# Patient Record
Sex: Male | Born: 1990 | Race: Black or African American | Hispanic: No | Marital: Single | Smoking: Never smoker
Health system: Southern US, Community
[De-identification: ages and names within clinical notes are randomized; demographics above are authoritative.]

## PROBLEM LIST (undated history)

## (undated) DIAGNOSIS — J45909 Unspecified asthma, uncomplicated: Secondary | ICD-10-CM

## (undated) HISTORY — PX: WRIST SURGERY: SHX841

---
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
## 2010-10-18 LAB — COMPREHENSIVE METABOLIC PANEL
AST: 50 — ABNORMAL HIGH
Albumin: 4.4
Alkaline Phosphatase: 64
BUN: 12
Chloride: 106
Potassium: 3.7
Sodium: 138
Total Bilirubin: 1.2
Total Protein: 7.7

## 2010-10-18 LAB — URINALYSIS, ROUTINE W REFLEX MICROSCOPIC
Protein, ur: NEGATIVE
Specific Gravity, Urine: 1.014
Urobilinogen, UA: 1

## 2010-10-18 LAB — URINE MICROSCOPIC-ADD ON

## 2010-10-18 LAB — RAPID URINE DRUG SCREEN, HOSP PERFORMED
Amphetamines: NOT DETECTED
Benzodiazepines: NOT DETECTED

## 2010-10-18 LAB — DIFFERENTIAL
Basophils Absolute: 0
Basophils Relative: 0
Eosinophils Relative: 2
Monocytes Absolute: 0.3
Monocytes Relative: 7
Neutro Abs: 3

## 2010-10-18 LAB — POCT I-STAT, CHEM 8
BUN: 14
Calcium, Ion: 1.14
Chloride: 105
Glucose, Bld: 94
HCT: 53 — ABNORMAL HIGH
TCO2: 24

## 2010-10-18 LAB — ETHANOL: Alcohol, Ethyl (B): 40 — ABNORMAL HIGH

## 2010-10-18 LAB — CBC
HCT: 50.1 — ABNORMAL HIGH
Platelets: 162
RDW: 12.8
WBC: 4.3 — ABNORMAL LOW

## 2016-01-08 ENCOUNTER — Emergency Department (HOSPITAL_COMMUNITY)
Admission: EM | Admit: 2016-01-08 | Discharge: 2016-01-08 | Disposition: A | Discharge status: Home / Self Care | Payer: Self-pay | Attending: Emergency Medicine | Admitting: Emergency Medicine

## 2016-01-08 ENCOUNTER — Encounter (HOSPITAL_COMMUNITY): Payer: Self-pay | Admitting: *Deleted

## 2016-01-08 DIAGNOSIS — R369 Urethral discharge, unspecified: Secondary | ICD-10-CM | POA: Insufficient documentation

## 2016-01-08 LAB — URINALYSIS, ROUTINE W REFLEX MICROSCOPIC
BILIRUBIN URINE: NEGATIVE
Bacteria, UA: NONE SEEN
GLUCOSE, UA: NEGATIVE mg/dL
Hgb urine dipstick: NEGATIVE
KETONES UR: NEGATIVE mg/dL
NITRITE: NEGATIVE
PH: 6 (ref 5.0–8.0)
Protein, ur: NEGATIVE mg/dL
Specific Gravity, Urine: 1.003 — ABNORMAL LOW (ref 1.005–1.030)
Squamous Epithelial / LPF: NONE SEEN

## 2016-01-08 MED ORDER — CEFTRIAXONE SODIUM 250 MG IJ SOLR
250.0000 mg | Freq: Once | INTRAMUSCULAR | Status: AC
  Administered 2016-01-08: 250 mg via INTRAMUSCULAR
  Filled 2016-01-08: qty 250

## 2016-01-08 MED ORDER — AZITHROMYCIN 250 MG PO TABS
1000.0000 mg | ORAL_TABLET | Freq: Once | ORAL | Status: AC
  Administered 2016-01-08: 1000 mg via ORAL
  Filled 2016-01-08: qty 4

## 2016-01-08 MED ORDER — STERILE WATER FOR INJECTION IJ SOLN
INTRAMUSCULAR | Status: AC
  Administered 2016-01-08: 10 mL
  Filled 2016-01-08: qty 10
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
## 2016-01-08 NOTE — ED Notes (Signed)
Pt is in stable condition upon d/c and ambulates from ED. 

## 2016-01-08 NOTE — ED Triage Notes (Signed)
Pt is requesting STD screening rt penile d/c and itching for over a week.

## 2016-01-08 NOTE — Discharge Instructions (Signed)
You were treated for Gonorrhea and Chlamydia Results will be available in 2 days. You can access them on MyChart Please have all partners tested Wear condoms to prevent infection or unwanted pregnancy

## 2016-01-09 LAB — HIV ANTIBODY (ROUTINE TESTING W REFLEX): HIV Screen 4th Generation wRfx: NONREACTIVE

## 2016-01-09 LAB — GC/CHLAMYDIA PROBE AMP (~~LOC~~) NOT AT ARMC
Chlamydia: POSITIVE — AB
Neisseria Gonorrhea: NEGATIVE

## 2016-01-09 LAB — RPR: RPR Ser Ql: NONREACTIVE

## 2016-01-11 ENCOUNTER — Encounter (HOSPITAL_COMMUNITY): Payer: Self-pay | Admitting: Medical

## 2016-06-26 ENCOUNTER — Emergency Department (HOSPITAL_COMMUNITY)
Admission: EM | Admit: 2016-06-26 | Discharge: 2016-06-26 | Disposition: A | Discharge status: Home / Self Care | Payer: Self-pay | Attending: Emergency Medicine | Admitting: Emergency Medicine

## 2016-06-26 ENCOUNTER — Encounter (HOSPITAL_COMMUNITY): Payer: Self-pay | Admitting: *Deleted

## 2016-06-26 DIAGNOSIS — R51 Headache: Secondary | ICD-10-CM | POA: Insufficient documentation

## 2016-06-26 DIAGNOSIS — I10 Essential (primary) hypertension: Secondary | ICD-10-CM | POA: Insufficient documentation

## 2016-06-26 HISTORY — DX: Unspecified asthma, uncomplicated: J45.909

## 2016-06-26 NOTE — ED Provider Notes (Signed)
[facility] Provider Note   CSN: [id number] Arrival date & time: 06/26/16  1217  By signing my name below, I, Chris Paul, attest that this documentation has been prepared under the direction and in the presence of Chris IndustriesJeff Kirah Stice, PA-C. Electronically Signed: Linna Darnerussell Paul, Scribe. 06/26/2016. 1:12 PM.  History   Chief Complaint Chief Complaint  Patient presents with  . Palpitations  . Headache   The history is provided by the patient. No language interpreter was used.    HPI Comments: Chris Paul is a 26 y.o. male who presents to the Emergency Department complaining of persistent hypertension and hypotension for four weeks. Patient donates plasma twice weekly and notes he has been hypertensive or hypotensive and intermittently tachycardic during each visit over the last four weeks. He states he stands in line anywhere from 30 seconds to 30 minutes prior to donating and sits for 2-3 minutes prior to having his blood pressure and pulse measured. He is reporting some intermittent mild headaches for the last two weeks but is otherwise asymptomatic. Patient denies regular caffeine intake but does note that he eats Ramen Noodles on a regular basis. He smokes marijuana but does not smoke cigarettes and denies any illicit drug use. Patient denies numbness/tingling, focal weakness, chest pain, leg swelling, or any other associated symptoms. No PCP or health insurance. He is currently employed.  Past Medical History:  Diagnosis Date  . Asthma     There are no active problems to display for this patient.   Past Surgical History:  Procedure Laterality Date  . WRIST SURGERY         Home Medications    Prior to Admission medications   Not on File    Family History No family history on file.  Social History Social History  Substance Use Topics  . Smoking status: Never Smoker  . Smokeless tobacco: Never Used  . Alcohol use Yes     Comment: 24 oz beer per day      Allergies   Patient has no known allergies.   Review of Systems Review of Systems  Cardiovascular: Negative for chest pain and leg swelling.  Neurological: Positive for headaches. Negative for weakness and numbness.  All other systems reviewed and are negative.  Physical Exam Updated Vital Signs BP (!) 141/84 (BP Location: Left Arm)   Pulse 62   Temp 97.4 F (36.3 C) (Oral)   Resp 16   Ht 5\' 9"  (1.753 m)   Wt 105.7 kg (233 lb)   SpO2 97%   BMI 34.41 kg/m   Physical Exam  Constitutional: He is oriented to person, place, and time. He appears well-developed and well-nourished. No distress.  HENT:  Head: Normocephalic and atraumatic.  Eyes: Conjunctivae and EOM are normal.  Neck: Neck supple. No tracheal deviation present.  Cardiovascular: Normal rate, regular rhythm and normal heart sounds.   Pulmonary/Chest: Effort normal and breath sounds normal. No respiratory distress. He has no wheezes. He has no rales.  Musculoskeletal: Normal range of motion.  Neurological: He is alert and oriented to person, place, and time.  Skin: Skin is warm and dry.  Psychiatric: He has a normal mood and affect. His behavior is normal.  Nursing note and vitals reviewed.  ED Treatments / Results  Labs (all labs ordered are listed, but only abnormal results are displayed) Labs Reviewed - No data to display  EKG  EKG Interpretation None       Radiology No results found.  Procedures Procedures (  including critical care time)  DIAGNOSTIC STUDIES: Oxygen Saturation is 97% on RA, normal by my interpretation.    COORDINATION OF CARE: 1:11 PM Discussed treatment plan with pt at bedside and pt agreed to plan.  Medications Ordered in ED Medications - No data to display   Initial Impression / Assessment and Plan / ED Course  I have reviewed the triage vital signs and the nursing notes.  Pertinent labs & imaging results that were available during my care of the patient were  reviewed by me and considered in my medical decision making (see chart for details).      Final Clinical Impressions(s) / ED Diagnoses   Final diagnoses:  Hypertension, unspecified type    Patient presents for evaluation of his blood pressure readings.  Patient has had both hypertension low blood pressure readings while at the plasma donation clinic.  He notes some tachycardia.  Very low suspicion for any cardiac abnormalities as patient is asymptomatic.  Patient has very reassuring vital signs here with only slight elevation in his blood pressure.  Patient denies any significant complaints including chest pain shortness of breath, palpitations dizziness or any other concerning signs or symptoms now or during these episodes.  Patient will be referred to primary care, given strict return precautions.  He verbalized understanding and agreement to today's plan had no further questions or concerns.  New Prescriptions There are no discharge medications for this patient.  I personally performed the services described in this documentation, which was scribed in my presence. The recorded information has been reviewed and is accurate.   Eyvonne Mechanic, PA-C 06/26/16 2006    Nira Conn, MD 06/29/16 0100

## 2016-06-26 NOTE — Discharge Instructions (Signed)
Please read attached information. If you experience any new or worsening signs or symptoms please return to the emergency room for evaluation. Please follow-up with your primary care provider or specialist as discussed.  °

## 2016-06-26 NOTE — ED Triage Notes (Signed)
Pt states "tachycardia" for several weeks.  Felt his heart racing over that past week and when he took his pulse it was 100.  Also states headache x 2 weeks, though denies nausea, blurred vision, etc.

## 2019-11-11 ENCOUNTER — Emergency Department (HOSPITAL_COMMUNITY)
Admission: EM | Admit: 2019-11-11 | Discharge: 2019-11-11 | Disposition: A | Discharge status: Home / Self Care | Payer: Self-pay | Attending: Emergency Medicine | Admitting: Emergency Medicine

## 2019-11-11 ENCOUNTER — Other Ambulatory Visit: Payer: Self-pay

## 2019-11-11 ENCOUNTER — Encounter (HOSPITAL_COMMUNITY): Payer: Self-pay | Admitting: *Deleted

## 2019-11-11 DIAGNOSIS — Z5321 Procedure and treatment not carried out due to patient leaving prior to being seen by health care provider: Secondary | ICD-10-CM | POA: Insufficient documentation

## 2019-11-11 DIAGNOSIS — N4889 Other specified disorders of penis: Secondary | ICD-10-CM | POA: Insufficient documentation
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
## 2019-11-11 NOTE — ED Notes (Signed)
Pt did not answer x 3 

## 2019-11-18 ENCOUNTER — Encounter (HOSPITAL_COMMUNITY): Payer: Self-pay

## 2019-11-18 ENCOUNTER — Other Ambulatory Visit: Payer: Self-pay

## 2019-11-18 ENCOUNTER — Emergency Department (HOSPITAL_COMMUNITY)
Admission: EM | Admit: 2019-11-18 | Discharge: 2019-11-18 | Disposition: A | Discharge status: Home / Self Care | Payer: Self-pay | Attending: Emergency Medicine | Admitting: Emergency Medicine

## 2019-11-18 DIAGNOSIS — J45909 Unspecified asthma, uncomplicated: Secondary | ICD-10-CM | POA: Insufficient documentation

## 2019-11-18 DIAGNOSIS — S3121XA Laceration without foreign body of penis, initial encounter: Secondary | ICD-10-CM | POA: Insufficient documentation

## 2019-11-18 DIAGNOSIS — X58XXXA Exposure to other specified factors, initial encounter: Secondary | ICD-10-CM | POA: Insufficient documentation
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
## 2019-11-18 NOTE — ED Provider Notes (Signed)
[facility] EMERGENCY DEPARTMENT Provider Note   CSN: [id number] Arrival date & time: 11/18/19  1647     History Chief Complaint  Patient presents with  . Laceration    Chris Paul is a 29 y.o. male.  Chris Paul is a 29 year old male who presents to the ED with 2 weeks of a poorly healing penile laceration.  His previous medical history is significant for being uncircumcised.  He reports that he was having intercourse 2 weeks ago when he felt the skin at the front of his penis tear during penetration.  At that time, he stopped intercourse and abstain from intercourse for several days until felt like the injury had improved.  Several days later, he attempted intercourse again when he again experienced a tearing of the skin at the front of his penis.  It has now been about 1.5 weeks since his last attempted intercourse and his penis does appear to be healing well.  He has not noted any swelling, redness or drainage from the small cut in the skin at the front of his penis.  He specifically denies penile discharge.  He continues to have morning erections but generally has been avoiding any sexual behavior in order to prevent as much time as possible for healing.  At this point, he is come to the ED because he feels that the small injury is not totally recovered wants to be sure that he has a full recovery.        Past Medical History:  Diagnosis Date  . Asthma     There are no problems to display for this patient.   Past Surgical History:  Procedure Laterality Date  . WRIST SURGERY         No family history on file.  Social History   Tobacco Use  . Smoking status: Never Smoker  . Smokeless tobacco: Never Used  Substance Use Topics  . Alcohol use: Yes    Comment: 24 oz beer per day  . Drug use: Yes    Types: Marijuana    Home Medications Prior to Admission medications   Not on File    Allergies    Patient has no known  allergies.  Review of Systems   Review of Systems  Constitutional: Negative for chills, fatigue and fever.  HENT: Negative for congestion and sore throat.   Respiratory: Negative for chest tightness and shortness of breath.   Gastrointestinal: Negative for nausea and vomiting.  Genitourinary: Positive for penile pain. Negative for discharge, dysuria, penile swelling, scrotal swelling, testicular pain and urgency.    Physical Exam Updated Vital Signs BP (!) 148/92   Pulse (!) 114   Temp 98.5 F (36.9 C) (Oral)   Resp 16   Ht 5\' 8"  (1.727 m)   Wt 113.4 kg   SpO2 95%   BMI 38.01 kg/m   Physical Exam Constitutional:      Appearance: Normal appearance.  HENT:     Head: Normocephalic and atraumatic.     Right Ear: External ear normal.     Left Ear: External ear normal.     Nose: Nose normal.     Mouth/Throat:     Mouth: Mucous membranes are dry.  Eyes:     Extraocular Movements: Extraocular movements intact.     Pupils: Pupils are equal, round, and reactive to light.  Pulmonary:     Effort: Pulmonary effort is normal. No respiratory distress.  Genitourinary:    Comments: Physical exam  was notable for a roughly 3 mm laceration noted on the frenulum of the penis.  There is no evidence of erythema, swelling, drainage.  The injury appears to be well-healing. Musculoskeletal:     Cervical back: Normal range of motion.  Skin:    Capillary Refill: Capillary refill takes less than 2 seconds.  Neurological:     General: No focal deficit present.     Mental Status: He is alert.     Cranial Nerves: No cranial nerve deficit.  Psychiatric:        Mood and Affect: Mood normal.        Behavior: Behavior normal.     ED Results / Procedures / Treatments   Labs (all labs ordered are listed, but only abnormal results are displayed) Labs Reviewed - No data to display  EKG None  Radiology No results found.  Procedures Procedures (including critical care time)  Medications  Ordered in ED Medications - No data to display  ED Course  I have reviewed the triage vital signs and the nursing notes.  Pertinent labs & imaging results that were available during my care of the patient were reviewed by me and considered in my medical decision making (see chart for details).    MDM Rules/Calculators/A&P                          Chris Paul is a 29 year old male who presents to the ED with 2 weeks of a poorly healing penile laceration.  His previous medical history is significant for being uncircumcised.  On physical exam, there was no evidence of infection in this small injury to the frenulum of his penis appeared to be well-healing.  He was encouraged to continue to abstain from intercourse until he is fully healed and to follow-up with urology in the next week or so for additional information and advice.   Final Clinical Impression(s) / ED Diagnoses Final diagnoses:  Laceration of penis, initial encounter    Rx / DC Orders ED Discharge Orders    None       Mirian Mo, MD 11/18/19 Ovidio Kin    Lorre Nick, MD 11/22/19 2313

## 2019-11-18 NOTE — Discharge Instructions (Addendum)
Overall, I think this small injury is going to heal well without complication.  I do think you should follow-up with a urologist in the next week or so.  Please call the number that we have provided to schedule an appointment.  Until you are seen by urology, I recommend you abstain from any vaginal intercourse or other sexual activity.

## 2020-08-29 ENCOUNTER — Emergency Department (HOSPITAL_COMMUNITY)
Admission: EM | Admit: 2020-08-29 | Discharge: 2020-08-30 | Disposition: A | Discharge status: Home / Self Care | Payer: Self-pay | Attending: Emergency Medicine | Admitting: Emergency Medicine

## 2020-08-29 DIAGNOSIS — Z5321 Procedure and treatment not carried out due to patient leaving prior to being seen by health care provider: Secondary | ICD-10-CM | POA: Insufficient documentation

## 2020-08-29 DIAGNOSIS — Z041 Encounter for examination and observation following transport accident: Secondary | ICD-10-CM | POA: Insufficient documentation
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
## 2020-08-29 NOTE — ED Notes (Signed)
Pt called to triage room x2, no answer

## 2020-08-29 NOTE — ED Notes (Signed)
Pt called to triage room x2, no answer 

## 2020-08-30 NOTE — ED Notes (Signed)
Significant other called regarding pt elopement & belongings left in triage. States she will come get bookbag when able.

## 2020-11-24 ENCOUNTER — Encounter (HOSPITAL_COMMUNITY): Payer: Self-pay | Admitting: Emergency Medicine

## 2020-11-24 ENCOUNTER — Emergency Department (HOSPITAL_COMMUNITY)
Admission: EM | Admit: 2020-11-24 | Discharge: 2020-11-24 | Disposition: A | Discharge status: Home / Self Care | Payer: Self-pay | Attending: Emergency Medicine | Admitting: Emergency Medicine

## 2020-11-24 ENCOUNTER — Other Ambulatory Visit: Payer: Self-pay

## 2020-11-24 DIAGNOSIS — R202 Paresthesia of skin: Secondary | ICD-10-CM | POA: Insufficient documentation

## 2020-11-24 DIAGNOSIS — J45909 Unspecified asthma, uncomplicated: Secondary | ICD-10-CM | POA: Insufficient documentation

## 2020-11-24 DIAGNOSIS — Z711 Person with feared health complaint in whom no diagnosis is made: Secondary | ICD-10-CM

## 2020-11-24 DIAGNOSIS — R369 Urethral discharge, unspecified: Secondary | ICD-10-CM | POA: Insufficient documentation

## 2020-11-24 LAB — URINALYSIS, ROUTINE W REFLEX MICROSCOPIC
Bacteria, UA: NONE SEEN
Bilirubin Urine: NEGATIVE
Glucose, UA: NEGATIVE mg/dL
Hgb urine dipstick: NEGATIVE
Ketones, ur: 5 mg/dL — AB
Nitrite: NEGATIVE
Protein, ur: NEGATIVE mg/dL
Specific Gravity, Urine: 1.014 (ref 1.005–1.030)
pH: 5 (ref 5.0–8.0)

## 2020-11-24 MED ORDER — CEFTRIAXONE SODIUM 500 MG IJ SOLR
500.0000 mg | Freq: Once | INTRAMUSCULAR | Status: AC
  Administered 2020-11-24: 500 mg via INTRAMUSCULAR
  Filled 2020-11-24: qty 500

## 2020-11-24 MED ORDER — LIDOCAINE HCL (PF) 1 % IJ SOLN
INTRAMUSCULAR | Status: AC
  Filled 2020-11-24: qty 5

## 2020-11-24 MED ORDER — DOXYCYCLINE HYCLATE 100 MG PO CAPS: 100.0000 mg | ORAL_CAPSULE | Freq: Two times a day (BID) | ORAL | 0 refills | Status: DC

## 2020-11-24 NOTE — ED Triage Notes (Signed)
Pt c/o 2 days of penile d/c and dysuria. Denies n/v/d, denies fever chills, abd pain

## 2020-11-24 NOTE — ED Provider Notes (Signed)
Emergency Medicine Provider Triage Evaluation Note  Chris Paul , a 29 y.o. male  was evaluated in triage.  Pt complains of 2 days of dysuria and penile discharge.  No fevers, chills, nausea, vomiting, Donnell pain.  He sexually active with 1 male partner, does not use condoms.  History of gonorrhea previously, has been treated appropriately.  This feels different than when he had gonorrhea before.  He is concerned for UTI. No medical problems.   Review of Systems  Positive: Dysuria, while penile discharge Negative: fever  Physical Exam  BP (!) 151/107 (BP Location: Left Arm)   Pulse 87   Temp 98.9 F (37.2 C) (Oral)   Resp 16   SpO2 100%  Gen:   Awake, no distress   Resp:  Normal effort  MSK:   Moves extremities without difficulty  Other:  No ttp of abd  Medical Decision Making  Medically screening exam initiated at 4:17 PM.  Appropriate orders placed.  JOSPEH MANGEL was informed that the remainder of the evaluation will be completed by another provider, this initial triage assessment does not replace that evaluation, and the importance of remaining in the ED until their evaluation is complete.  Ua, gc/cl   Alveria Apley, PA-C 11/24/20 1617    Sloan Leiter, DO 11/24/20 1955

## 2020-11-24 NOTE — Discharge Instructions (Addendum)
I am prescribing you an antibiotic called doxycycline.  Please take this twice a day for the next 7 days.  Do not stop taking this antibiotic early.  Please refrain from sexual intercourse until you complete this antibiotic.  Please check the results of your gonorrhea/chlamydia test on MyChart.  This should be available in the next 2 to 3 days.  If you develop any new or worsening symptoms please come back to the emergency department.  It was a pleasure to meet you.

## 2020-11-24 NOTE — ED Notes (Signed)
Patient verbalizes understanding of discharge instructions. Prescriptions and follow-up care reviewed. Opportunity for questioning and answers were provided. Armband removed by staff, pt discharged from ED ambulatory.  

## 2020-11-24 NOTE — ED Provider Notes (Signed)
[facility] EMERGENCY DEPARTMENT Provider Note   CSN: [id number] Arrival date & time: 11/24/20  1406     History Chief Complaint  Patient presents with   Penile Discharge    Chris Paul is a 30 y.o. male.  HPI  Patient is a 30 year old male who presents to the emergency department due to penile discharge.  Patient states about a week ago he began experiencing "tingling" with urination.  Denies any dysuria or hematuria.  Yesterday after urinating he noticed a small amount of white discharge.  States this has not recurred since yesterday.  Denies any fevers, chills, nausea, vomiting, diarrhea, abdominal pain.  States he has been in a monogamous relationship with the same male partner for the last 3 years.  Denies using protection.     Past Medical History:  Diagnosis Date   Asthma     There are no problems to display for this patient.   Past Surgical History:  Procedure Laterality Date   WRIST SURGERY         No family history on file.  Social History   Tobacco Use   Smoking status: Never   Smokeless tobacco: Never  Substance Use Topics   Alcohol use: Yes    Comment: 24 oz beer per day   Drug use: Yes    Types: Marijuana    Home Medications Prior to Admission medications   Medication Sig Start Date End Date Taking? Authorizing Provider  doxycycline (VIBRAMYCIN) 100 MG capsule Take 1 capsule (100 mg total) by mouth 2 (two) times daily. 11/24/20  Yes Placido Sou, PA-C    Allergies    Patient has no known allergies.  Review of Systems   Review of Systems  Constitutional:  Negative for chills and fever.  Gastrointestinal:  Negative for abdominal pain, diarrhea, nausea and vomiting.  Genitourinary:  Positive for penile discharge. Negative for dysuria, hematuria, penile pain, penile swelling, scrotal swelling, testicular pain and urgency.   Physical Exam Updated Vital Signs BP (!) 151/107 (BP Location: Left Arm)   Pulse 87    Temp 98.9 F (37.2 C) (Oral)   Resp 16   SpO2 100%   Physical Exam Vitals and nursing note reviewed.  Constitutional:      General: He is not in acute distress.    Appearance: He is well-developed.  HENT:     Head: Normocephalic and atraumatic.     Right Ear: External ear normal.     Left Ear: External ear normal.  Eyes:     General: No scleral icterus.       Right eye: No discharge.        Left eye: No discharge.     Conjunctiva/sclera: Conjunctivae normal.  Neck:     Trachea: No tracheal deviation.  Cardiovascular:     Rate and Rhythm: Normal rate.  Pulmonary:     Effort: Pulmonary effort is normal. No respiratory distress.     Breath sounds: No stridor.  Abdominal:     General: Abdomen is flat. There is no distension.     Palpations: Abdomen is soft.  Genitourinary:    Comments: Patient deferred GU exam.  Musculoskeletal:        General: No swelling or deformity.     Cervical back: Neck supple.  Skin:    General: Skin is warm and dry.     Findings: No rash.  Neurological:     Mental Status: He is alert.     Cranial Nerves:  Cranial nerve deficit: no gross deficits.   ED Results / Procedures / Treatments   Labs (all labs ordered are listed, but only abnormal results are displayed) Labs Reviewed  URINALYSIS, ROUTINE W REFLEX MICROSCOPIC - Abnormal; Notable for the following components:      Result Value   Ketones, ur 5 (*)    Leukocytes,Ua SMALL (*)    All other components within normal limits  GC/CHLAMYDIA PROBE AMP ([place]) NOT AT [facility]    EKG None  Radiology No results found.  Procedures Procedures   Medications Ordered in ED Medications  cefTRIAXone (ROCEPHIN) injection 500 mg (has no administration in time range)    ED Course  I have reviewed the triage vital signs and the nursing notes.  Pertinent labs & imaging results that were available during my care of the patient were reviewed by me and considered in my medical decision making (see  chart for details).    MDM Rules/Calculators/A&P                          Patient is a 30 year old male who presents to the emergency department due to tingling sensation with urination as well as an episode of penile discharge yesterday.  States that he is sexually active with one male and has been in a long-term monogamous relationship.  Denies that he uses protection.  UA shows 5 ketones, small leukocytes, 11-20 white blood cells.  GC/chlamydia is pending.  Patient deferred GU exam.  Denies any other complaints at this time.  States that he would prefer to be treated prophylactically for gonorrhea/chlamydia.  Will treat with IM Rocephin as well as p.o. doxycycline.  Urged patient to refrain from sexual intercourse until he completes his antibiotics.  Patient is going to check the results of his test on MyChart.  We discussed return precautions.  His questions were answered and he was amicable at the time of discharge.  Final Clinical Impression(s) / ED Diagnoses Final diagnoses:  Concern about STD in male without diagnosis   Rx / DC Orders ED Discharge Orders          Ordered    doxycycline (VIBRAMYCIN) 100 MG capsule  2 times daily        11/24/20 1901             Rayna Sexton, PA-C 11/24/20 1912    Horton, Alvin Critchley, DO 11/25/20 1854

## 2022-01-29 ENCOUNTER — Emergency Department (HOSPITAL_COMMUNITY)
Admission: EM | Admit: 2022-01-29 | Discharge: 2022-01-29 | Disposition: A | Discharge status: Home / Self Care | Payer: Self-pay | Attending: Emergency Medicine | Admitting: Emergency Medicine

## 2022-01-29 ENCOUNTER — Other Ambulatory Visit: Payer: Self-pay

## 2022-01-29 ENCOUNTER — Encounter (HOSPITAL_COMMUNITY): Payer: Self-pay | Admitting: Emergency Medicine

## 2022-01-29 DIAGNOSIS — Z202 Contact with and (suspected) exposure to infections with a predominantly sexual mode of transmission: Secondary | ICD-10-CM | POA: Insufficient documentation

## 2022-01-29 DIAGNOSIS — Z711 Person with feared health complaint in whom no diagnosis is made: Secondary | ICD-10-CM

## 2022-01-29 DIAGNOSIS — R369 Urethral discharge, unspecified: Secondary | ICD-10-CM | POA: Insufficient documentation

## 2022-01-29 MED ORDER — DOXYCYCLINE HYCLATE 100 MG PO TABS
100.0000 mg | ORAL_TABLET | Freq: Once | ORAL | Status: AC
  Administered 2022-01-29: 100 mg via ORAL
  Filled 2022-01-29: qty 1

## 2022-01-29 MED ORDER — STERILE WATER FOR INJECTION IJ SOLN
INTRAMUSCULAR | Status: AC
  Filled 2022-01-29: qty 10

## 2022-01-29 MED ORDER — DOXYCYCLINE HYCLATE 100 MG PO CAPS: 100.0000 mg | ORAL_CAPSULE | Freq: Two times a day (BID) | ORAL | 0 refills | Status: AC

## 2022-01-29 MED ORDER — CEFTRIAXONE SODIUM 500 MG IJ SOLR
500.0000 mg | Freq: Once | INTRAMUSCULAR | Status: AC
  Administered 2022-01-29: 500 mg via INTRAMUSCULAR
  Filled 2022-01-29: qty 500

## 2022-01-29 NOTE — Discharge Instructions (Addendum)
You were seen in the emergency department for your penile discomfort and discharge.  We tested you today for gonorrhea and chlamydia nasal take several hours to come back.  You can follow-up your results on your patient portal.  We did give you antibiotic treatment and you should complete this as prescribed.  You should recommend that your partners be tested and treated as well.  You should follow-up with your primary doctor to have your symptoms rechecked.  You should return to the emergency department if you are having testicular pain or swelling or if you have any other new or concerning symptoms.

## 2022-01-29 NOTE — ED Provider Triage Note (Signed)
Emergency Medicine Provider Triage Evaluation Note  Chris Paul , a 32 y.o. male  was evaluated in triage.  Pt complains of white penile discharge   Physical Exam  BP (!) 172/109 (BP Location: Right Arm)   Pulse 98   Temp 98 F (36.7 C) (Oral)   Resp 19   Ht 5\' 8"  (1.727 m)   Wt 113.4 kg   SpO2 100%   BMI 38.01 kg/m  Gen:   Awake, no distress   Resp:  Normal effort  MSK:   Moves extremities without difficulty  Other:   Medical Decision Making  Medically screening exam initiated at 7:57 PM.  Appropriate orders placed.  Chris Paul was informed that the remainder of the evaluation will be completed by another provider, this initial triage assessment does not replace that evaluation, and the importance of remaining in the ED until their evaluation is complete.     Fransico Meadow, [place] 01/29/22 [phone]

## 2022-01-29 NOTE — ED Notes (Signed)
Pt's BP 172/109. Pt states being told he has had high BP when donating plasma in the past. States he is not currently taking in medications to manage bp

## 2022-01-29 NOTE — ED Triage Notes (Signed)
Pt c/o penile discharge and itchiness for 3 days. Endorses two sexual partners in the past 3 months. Denies pain. No problems with urination.

## 2022-01-29 NOTE — ED Provider Notes (Signed)
[facility] EMERGENCY DEPARTMENT Provider Note   CSN: [id number] Arrival date & time: 01/29/22  1817     History  Chief Complaint  Patient presents with   Penile Discharge    Chris Paul is a 32 y.o. male.  Patient is a 32 year old male with no significant past medical history presenting to the emergency department with penile discomfort.  The patient states that he has been getting an intermittent itching and twinge of pain in his penis.  He states that today when he urinated he saw a small amount of discharge.  He denies any rashes or lesions, testicular pain or swelling, abdominal pain, nausea, vomiting or fevers.  He states that he did have unprotected sex over the weekend but has not had any recent new sexual partners.  The history is provided by the patient.  Penile Discharge       Home Medications Prior to Admission medications   Medication Sig Start Date End Date Taking? Authorizing Provider  doxycycline (VIBRAMYCIN) 100 MG capsule Take 1 capsule (100 mg total) by mouth 2 (two) times daily for 7 days. 01/29/22 02/05/22 Yes Leanord Asal K, DO      Allergies    Patient has no known allergies.    Review of Systems   Review of Systems  Genitourinary:  Positive for penile discharge.    Physical Exam Updated Vital Signs BP (!) 172/109 (BP Location: Right Arm)   Pulse 98   Temp 98 F (36.7 C) (Oral)   Resp 19   Ht 5\' 8"  (1.727 m)   Wt 113.4 kg   SpO2 100%   BMI 38.01 kg/m  Physical Exam Vitals and nursing note reviewed.  Constitutional:      General: He is not in acute distress.    Appearance: Normal appearance.  HENT:     Head: Normocephalic and atraumatic.     Nose: Nose normal.     Mouth/Throat:     Mouth: Mucous membranes are moist.     Pharynx: Oropharynx is clear.  Eyes:     Extraocular Movements: Extraocular movements intact.     Conjunctiva/sclera: Conjunctivae normal.  Cardiovascular:     Rate and Rhythm: Normal  rate and regular rhythm.     Heart sounds: Normal heart sounds.  Pulmonary:     Effort: Pulmonary effort is normal.     Breath sounds: Normal breath sounds.  Abdominal:     General: Abdomen is flat.     Palpations: Abdomen is soft.     Tenderness: There is no abdominal tenderness.  Musculoskeletal:        General: Normal range of motion.     Cervical back: Normal range of motion and neck supple.  Skin:    General: Skin is warm and dry.  Neurological:     General: No focal deficit present.     Mental Status: He is alert and oriented to person, place, and time.  Psychiatric:        Mood and Affect: Mood normal.        Behavior: Behavior normal.     ED Results / Procedures / Treatments   Labs (all labs ordered are listed, but only abnormal results are displayed) Labs Reviewed  URINE CYTOLOGY ANCILLARY ONLY  GC/CHLAMYDIA PROBE AMP ([place]) NOT AT [facility]    EKG None  Radiology No results found.  Procedures Procedures    Medications Ordered in ED Medications  cefTRIAXone (ROCEPHIN) injection 500 mg (has no administration  in time range)  doxycycline (VIBRA-TABS) tablet 100 mg (has no administration in time range)    ED Course/ Medical Decision Making/ A&P                           Medical Decision Making This patient presents to the ED with chief complaint(s) of penile discomfort with no pertinent past medical history which further complicates the presenting complaint. The complaint involves an extensive differential diagnosis and also carries with it a high risk of complications and morbidity.    The differential diagnosis includes STI, no urinary symptoms making UTI unlikely, no testicular pain or swelling making torsion or epididymitis unlikely    Additional history obtained: Additional history obtained from N/A Records reviewed prior ED records  ED Course and Reassessment: Patient is agreeable for gonorrhea and Chlamydia testing and would like prophylactic  treatment.  He declined testing for HIV or syphilis.  The patient has no testicular pain or swelling making epididymitis or torsion unlikely.  The patient is stable for discharge home and was recommended to follow-up his results on MyChart.  We recommended for his partners to be tested and treated as well and was recommended primary care follow-up.  Independent labs interpretation:  The following labs were independently interpreted: pending  Independent visualization of imaging: N/A  Consultation: - Consulted or discussed management/test interpretation w/ external professional: N/A  Consideration for admission or further workup: Patient has no emergent conditions requiring admission or further work-up at this time and is stable for discharge home with primary care follow-up  Social Determinants of health: N/A    Risk Prescription drug management.          Final Clinical Impression(s) / ED Diagnoses Final diagnoses:  Concern about STD in male without diagnosis  Penile discharge    Rx / DC Orders ED Discharge Orders          Ordered    doxycycline (VIBRAMYCIN) 100 MG capsule  2 times daily        01/29/22 2231              Rexford Maus, DO 01/29/22 2232

## 2022-01-31 LAB — URINE CYTOLOGY ANCILLARY ONLY
Chlamydia: NEGATIVE
Comment: NEGATIVE
Comment: NORMAL
Neisseria Gonorrhea: NEGATIVE

## 2022-10-15 ENCOUNTER — Emergency Department (HOSPITAL_COMMUNITY)
Admission: EM | Admit: 2022-10-15 | Discharge: 2022-10-15 | Disposition: A | Discharge status: Home / Self Care | Payer: Self-pay | Attending: Emergency Medicine | Admitting: Emergency Medicine

## 2022-10-15 ENCOUNTER — Other Ambulatory Visit: Payer: Self-pay

## 2022-10-15 ENCOUNTER — Encounter (HOSPITAL_COMMUNITY): Payer: Self-pay

## 2022-10-15 ENCOUNTER — Emergency Department (HOSPITAL_COMMUNITY): Payer: Self-pay

## 2022-10-15 DIAGNOSIS — J45909 Unspecified asthma, uncomplicated: Secondary | ICD-10-CM | POA: Insufficient documentation

## 2022-10-15 DIAGNOSIS — K852 Alcohol induced acute pancreatitis without necrosis or infection: Secondary | ICD-10-CM | POA: Insufficient documentation

## 2022-10-15 DIAGNOSIS — R112 Nausea with vomiting, unspecified: Secondary | ICD-10-CM | POA: Insufficient documentation

## 2022-10-15 LAB — CBC
HCT: 53.5 % — ABNORMAL HIGH (ref 39.0–52.0)
Hemoglobin: 18.4 g/dL — ABNORMAL HIGH (ref 13.0–17.0)
MCH: 33.3 pg (ref 26.0–34.0)
MCHC: 34.4 g/dL (ref 30.0–36.0)
MCV: 96.9 fL (ref 80.0–100.0)
Platelets: 152 10*3/uL (ref 150–400)
RBC: 5.52 MIL/uL (ref 4.22–5.81)
RDW: 12 % (ref 11.5–15.5)
WBC: 9.2 10*3/uL (ref 4.0–10.5)
nRBC: 0 % (ref 0.0–0.2)

## 2022-10-15 LAB — URINALYSIS, ROUTINE W REFLEX MICROSCOPIC
Bacteria, UA: NONE SEEN
Bilirubin Urine: NEGATIVE
Glucose, UA: NEGATIVE mg/dL
Hgb urine dipstick: NEGATIVE
Ketones, ur: 80 mg/dL — AB
Leukocytes,Ua: NEGATIVE
Nitrite: NEGATIVE
Protein, ur: 30 mg/dL — AB
Specific Gravity, Urine: >1.046 — ABNORMAL HIGH (ref 1.005–1.030)
pH: 5 (ref 5.0–8.0)

## 2022-10-15 LAB — COMPREHENSIVE METABOLIC PANEL
ALT: 188 U/L — ABNORMAL HIGH (ref 0–44)
AST: 201 U/L — ABNORMAL HIGH (ref 15–41)
Albumin: 4.2 g/dL (ref 3.5–5.0)
Alkaline Phosphatase: 53 U/L (ref 38–126)
Anion gap: 13 (ref 5–15)
BUN: 10 mg/dL (ref 6–20)
CO2: 22 mmol/L (ref 22–32)
Calcium: 9.4 mg/dL (ref 8.9–10.3)
Chloride: 101 mmol/L (ref 98–111)
Creatinine, Ser: 0.98 mg/dL (ref 0.61–1.24)
GFR, Estimated: >60 mL/min (ref >60)
Glucose, Bld: 128 mg/dL — ABNORMAL HIGH (ref 70–99)
Potassium: 3.6 mmol/L (ref 3.5–5.1)
Sodium: 136 mmol/L (ref 135–145)
Total Bilirubin: 2.9 mg/dL — ABNORMAL HIGH (ref 0.3–1.2)
Total Protein: 8 g/dL (ref 6.5–8.1)

## 2022-10-15 LAB — LIPASE, BLOOD: Lipase: 57 U/L — ABNORMAL HIGH (ref 11–51)

## 2022-10-15 MED ORDER — DIPHENHYDRAMINE HCL 50 MG/ML IJ SOLN
25.0000 mg | Freq: Once | INTRAMUSCULAR | Status: AC
  Administered 2022-10-15: 25 mg via INTRAVENOUS
  Filled 2022-10-15: qty 1

## 2022-10-15 MED ORDER — ONDANSETRON 4 MG PO TBDP: 4.0000 mg | ORAL_TABLET | Freq: Three times a day (TID) | ORAL | 0 refills | Status: AC | PRN

## 2022-10-15 MED ORDER — KETOROLAC TROMETHAMINE 15 MG/ML IJ SOLN
15.0000 mg | Freq: Once | INTRAMUSCULAR | Status: AC
  Administered 2022-10-15: 15 mg via INTRAVENOUS
  Filled 2022-10-15: qty 1

## 2022-10-15 MED ORDER — PANTOPRAZOLE SODIUM 40 MG PO TBEC: 40.0000 mg | DELAYED_RELEASE_TABLET | Freq: Every day | ORAL | 0 refills | Status: AC

## 2022-10-15 MED ORDER — METOCLOPRAMIDE HCL 5 MG/ML IJ SOLN
10.0000 mg | Freq: Once | INTRAMUSCULAR | Status: AC
  Administered 2022-10-15: 10 mg via INTRAVENOUS
  Filled 2022-10-15: qty 2

## 2022-10-15 MED ORDER — IOHEXOL 350 MG/ML SOLN
75.0000 mL | Freq: Once | INTRAVENOUS | Status: AC | PRN
  Administered 2022-10-15: 75 mL via INTRAVENOUS

## 2022-10-15 MED ORDER — LACTATED RINGERS IV BOLUS
1000.0000 mL | Freq: Once | INTRAVENOUS | Status: AC
  Administered 2022-10-15: 1000 mL via INTRAVENOUS

## 2022-10-15 MED ORDER — SODIUM CHLORIDE 0.9 % IV BOLUS
1000.0000 mL | Freq: Once | INTRAVENOUS | Status: AC
  Administered 2022-10-15: 1000 mL via INTRAVENOUS

## 2022-10-15 NOTE — Discharge Instructions (Addendum)
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  I have provided you with an eating plan.  I recommend to eating very bland foods, and not eating much, I recommend mostly drinking water, and other fluids other than alcohol.  Return to the ER if you have intractable nausea, vomiting, or severe pain.  I recommend taking some Tylenol for your pain at home.

## 2022-10-15 NOTE — ED Provider Notes (Signed)
[place] EMERGENCY DEPARTMENT AT [facility] Provider Note   CSN: [id number] Arrival date & time: 10/15/22  1624     History  Chief Complaint  Patient presents with   Abdominal Pain    Chris Paul is a 32 y.o. male, history of alcohol abuse, asthma, who presents to the ED secondary to intractable nausea, vomiting, and diffuse abdominal pain has been going on for the last day.  He states that for the past day, he has had generalized abdominal pain but mostly in the middle of his abdomen, around his bellybutton, and it just feels pressurized and uncomfortable in nature.  Since then he has had intractable nausea, vomiting, and has not been able to tolerate any p.o. intake.  Does endorse smoking weed twice daily.  No known bad food exposures.  No sore throat, cough, shortness of breath.  States he has never had this happen before. Last BM was 1 day ago.     Home Medications Prior to Admission medications   Medication Sig Start Date End Date Taking? Authorizing Provider  ondansetron (ZOFRAN-ODT) 4 MG disintegrating tablet Take 1 tablet (4 mg total) by mouth every 8 (eight) hours as needed for nausea or vomiting. 10/15/22  Yes Tinley Rought L, PA  pantoprazole (PROTONIX) 40 MG tablet Take 1 tablet (40 mg total) by mouth daily. 10/15/22  Yes Renata Gambino L, PA      Allergies    Patient has no known allergies.    Review of Systems   Review of Systems  Respiratory:  Negative for shortness of breath.   Gastrointestinal:  Positive for abdominal pain, nausea and vomiting. Negative for constipation and diarrhea.    Physical Exam Updated Vital Signs BP (!) 159/101   Pulse (!) 111   Temp 97.9 F (36.6 C) (Oral)   Resp (!) 33   Ht 5\' 8"  (1.727 m)   Wt 108.9 kg   SpO2 100%   BMI 36.49 kg/m  Physical Exam Vitals and nursing note reviewed.  Constitutional:      General: He is not in acute distress.    Appearance: He is well-developed.  HENT:     Head: Normocephalic  and atraumatic.  Eyes:     Conjunctiva/sclera: Conjunctivae normal.  Cardiovascular:     Rate and Rhythm: Normal rate and regular rhythm.     Heart sounds: No murmur heard. Pulmonary:     Effort: Pulmonary effort is normal. No respiratory distress.     Breath sounds: Normal breath sounds.  Abdominal:     Palpations: Abdomen is soft.     Tenderness: There is generalized abdominal tenderness. There is no guarding or rebound.  Musculoskeletal:        General: No swelling.     Cervical back: Neck supple.  Skin:    General: Skin is warm and dry.     Capillary Refill: Capillary refill takes less than 2 seconds.  Neurological:     Mental Status: He is alert.  Psychiatric:        Mood and Affect: Mood normal.     ED Results / Procedures / Treatments   Labs (all labs ordered are listed, but only abnormal results are displayed) Labs Reviewed  LIPASE, BLOOD - Abnormal; Notable for the following components:      Result Value   Lipase 57 (*)    All other components within normal limits  COMPREHENSIVE METABOLIC PANEL - Abnormal; Notable for the following components:   Glucose, Bld 128 (*)  AST 201 (*)    ALT 188 (*)    Total Bilirubin 2.9 (*)    All other components within normal limits  CBC - Abnormal; Notable for the following components:   Hemoglobin 18.4 (*)    HCT 53.5 (*)    All other components within normal limits  URINALYSIS, ROUTINE W REFLEX MICROSCOPIC - Abnormal; Notable for the following components:   Color, Urine AMBER (*)    Specific Gravity, Urine >1.046 (*)    Ketones, ur 80 (*)    Protein, ur 30 (*)    All other components within normal limits    EKG None  Radiology CT ABDOMEN PELVIS W CONTRAST  Result Date: 10/15/2022 CLINICAL DATA:  Nonlocalized abdomen pain EXAM: CT ABDOMEN AND PELVIS WITH CONTRAST TECHNIQUE: Multidetector CT imaging of the abdomen and pelvis was performed using the standard protocol following bolus administration of intravenous  contrast. RADIATION DOSE REDUCTION: This exam was performed according to the departmental dose-optimization program which includes automated exposure control, adjustment of the mA and/or kV according to patient size and/or use of iterative reconstruction technique. CONTRAST:  75mL OMNIPAQUE IOHEXOL 350 MG/ML SOLN COMPARISON:  None Available. FINDINGS: Lower chest: Lung bases demonstrate no acute airspace disease. Hepatobiliary: Hepatic steatosis. No calcified gallstone or biliary dilatation Pancreas: Enlarged pancreatic head with considerable inflammatory change and fluid in the right anterior pararenal space consistent with acute pancreatitis. No organized fluid collection. Spleen: Normal in size without focal abnormality. Adrenals/Urinary Tract: Adrenal glands are unremarkable. Kidneys are normal, without renal calculi, focal lesion, or hydronephrosis. Bladder is unremarkable. Stomach/Bowel: Stomach nonenlarged. Wall thickening and inflammation second and third portion of duodenum. Remainder of the bowel is unremarkable. Normal appendix Vascular/Lymphatic: No significant vascular findings are present. No enlarged abdominal or pelvic lymph nodes. Patent portal and splenic veins. Reproductive: Prostate is unremarkable. Other: No free air. Musculoskeletal: No acute osseous abnormality. IMPRESSION: 1. Findings consistent with acute pancreatitis. No organized fluid collection. 2. Wall thickening and inflammation of the second and third portion of duodenum consistent with duodenitis or reactive inflammation from pancreatitis 3. Hepatic steatosis. Electronically Signed   By: Jasmine Pang M.D.   On: 10/15/2022 20:55    Procedures Procedures    Medications Ordered in ED Medications  metoCLOPramide (REGLAN) injection 10 mg (10 mg Intravenous Given 10/15/22 1934)  diphenhydrAMINE (BENADRYL) injection 25 mg (25 mg Intravenous Given 10/15/22 1934)  ketorolac (TORADOL) 15 MG/ML injection 15 mg (15 mg Intravenous Given  10/15/22 1934)  sodium chloride 0.9 % bolus 1,000 mL (0 mLs Intravenous Stopped 10/15/22 2052)  iohexol (OMNIPAQUE) 350 MG/ML injection 75 mL (75 mLs Intravenous Contrast Given 10/15/22 2018)  lactated ringers bolus 1,000 mL (1,000 mLs Intravenous New Bag/Given 10/15/22 2123)    ED Course/ Medical Decision Making/ A&P                                 Medical Decision Making Patient is a 32 year old male, here for abdominal pain, has been gone for 1 day, and nausea, vomiting.  He drinks about 3 beers or alcoholic beverages a day.  States it happened after he started drinking mikes hard lemonade.  Concern for possible intra-abdominal process, given his epigastric right upper quadrant pain, we will obtain blood work, CT abdomen pelvis, for further evaluation.  He is also tachycardic and has dry mucosal membranes.  Will give IV fluids, and medication for nausea/vomiting  Amount and/or Complexity of Data Reviewed  Labs: ordered.    Details: Lipase of 57, bilirubin elevated, elevated LFTs Radiology: ordered.    Details: CT abdomen pelvis shows acute pancreatitis, and duodenitis Discussion of management or test interpretation with external provider(s): Discussed with patient, he is feeling much better, we will send him home with Zofran, and pantoprazole, and I discussed alcohol cessation.  He is requesting go home, pain is completely resolved, and he was able to tolerate p.o. intake.  Discussed with patient, importance of follow-up, and he voiced understanding.  Possible persistent tachycardia, might be secondary to still dehydration, and giving him a second liter of fluid, versus possible withdrawal.  Discussed with patient, follow-up PCP, he has no other complaints.  Discharged home  Risk Prescription drug management.   Final Clinical Impression(s) / ED Diagnoses Final diagnoses:  Alcohol-induced acute pancreatitis, unspecified complication status    Rx / DC Orders ED Discharge Orders           Ordered    ondansetron (ZOFRAN-ODT) 4 MG disintegrating tablet  Every 8 hours PRN        10/15/22 2153    pantoprazole (PROTONIX) 40 MG tablet  Daily        10/15/22 2154              Ngan Qualls, [place], [place] 10/15/22 2203    Benjiman Core, MD 10/16/22 1439

## 2022-10-15 NOTE — ED Triage Notes (Signed)
Pt coming in with c/o abdominal pain that began yesterday around 1800. Pt states "I feel like I have a knot in my stomach." Pt states he has been unable to hold anything down and endorses constipation. Pt states abdominal pain is upper quadrant but "the pain moves when to whatever side I lay on." He states he took pepto bismol but immediately threw up afterwards. He states he drinks about 3 drinks/day and last drink was yesterday. Denies CP and difficulty breathing. Pt a&ox4.

## 2022-10-15 NOTE — ED Notes (Signed)
Patient verbalizes understanding of discharge instructions. Opportunity for questioning and answers were provided. Armband removed by staff, pt discharged from ED. Pt ambulatory to ED waiting room with steady gait.

## 2024-02-18 ENCOUNTER — Emergency Department (HOSPITAL_COMMUNITY): Admission: EM | Admit: 2024-02-18 | Discharge: 2024-02-18 | Discharge status: Against Medical Advice | Payer: Self-pay

## 2024-02-18 NOTE — ED Notes (Signed)
 Pt left ama

## 2024-02-18 NOTE — ED Notes (Signed)
 Pt seen leaving ED waiting room.
